# Patient Record
Sex: Male | Born: 1995 | Race: White | Hispanic: No | Marital: Single
Health system: Southern US, Community
[De-identification: ages and names within clinical notes are randomized; demographics above are authoritative.]

## PROBLEM LIST (undated history)

## (undated) HISTORY — PX: INGUINAL HERNIA REPAIR: SHX194

## (undated) HISTORY — PX: HYDROCELE EXCISION / REPAIR: SUR1145

---
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
## 2014-07-13 IMAGING — CR PELVIS - 1-2 VIEW
1 series · 1 of 1 positions shown · non-contrast
Comparison: none

REASON FOR EXAM: twisted playing soccer last night
COMMENTS:   LMP: (Male)

[ap]
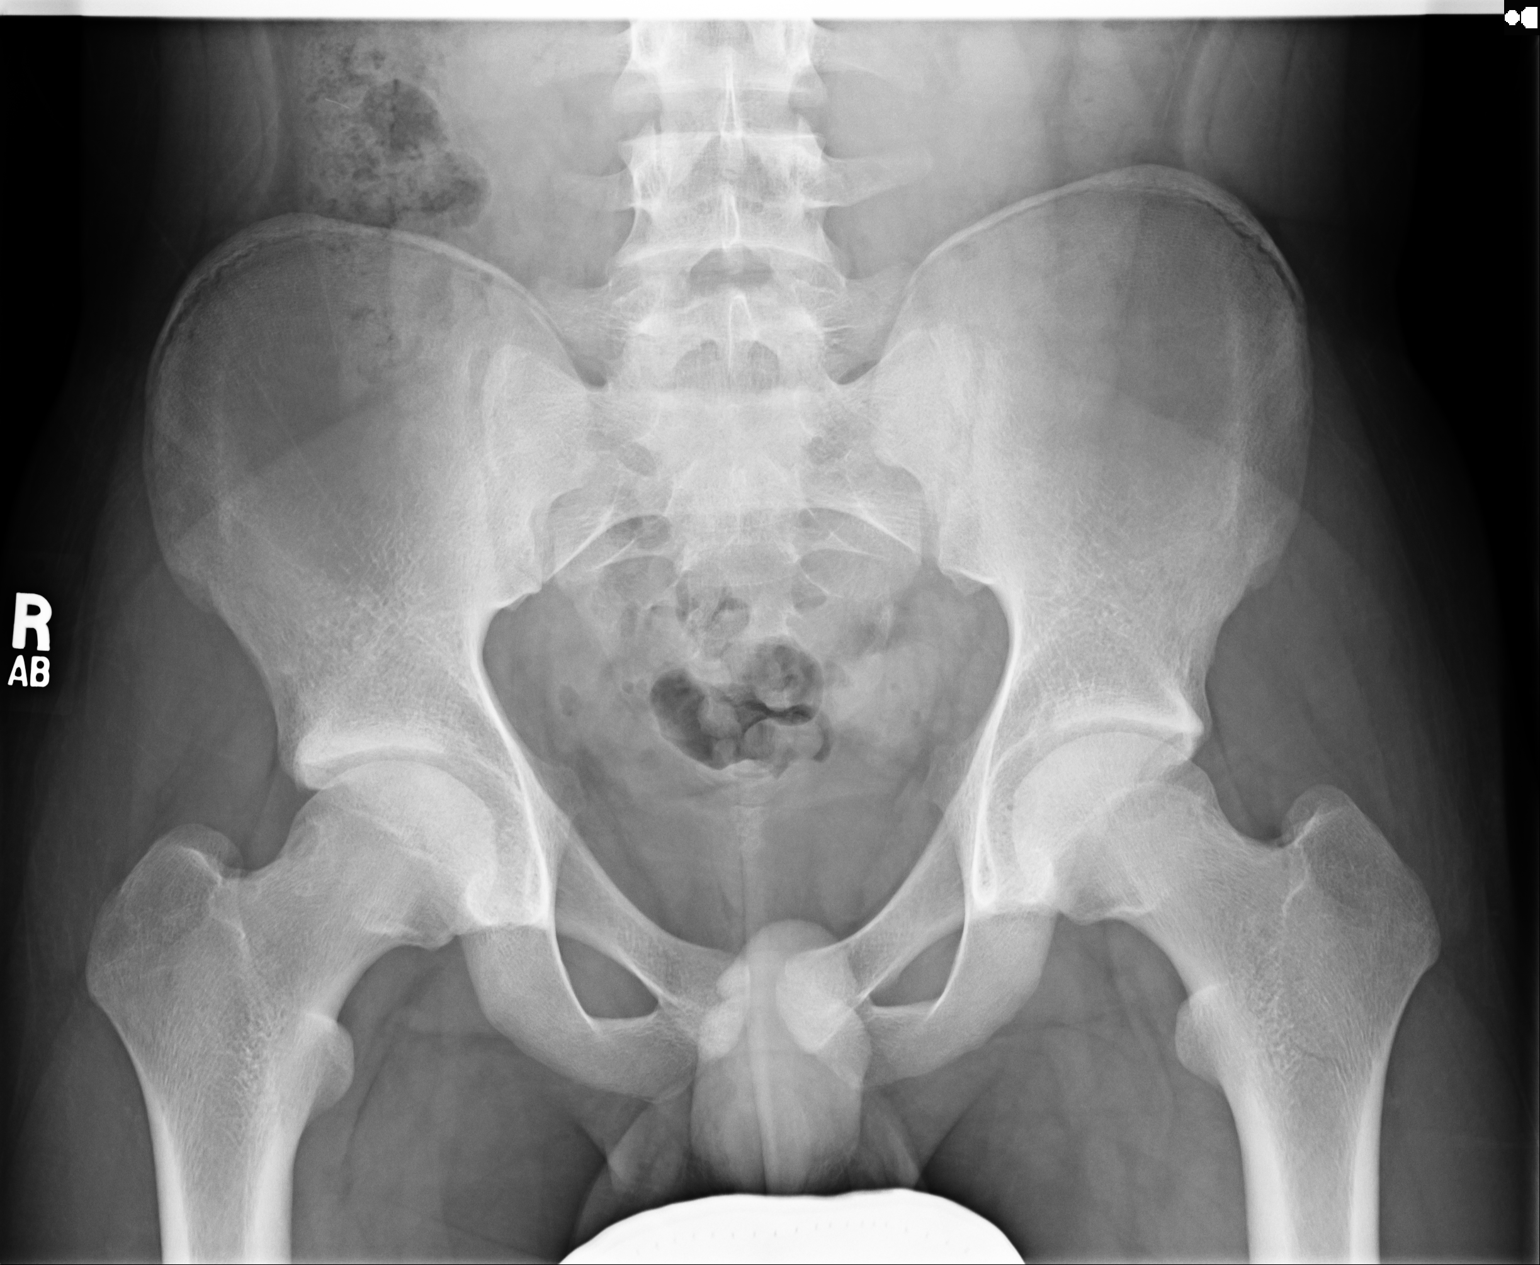

[1 of 1 positions shown; findings below may reference images not displayed]

PROCEDURE:     MDR - MDR PELVIS AP ONLY  - April 24, 2012  [DATE]

RESULT:     The bony pelvis appears adequately mineralized. I do not see an
acute pelvic fracture. There is a linear lucency that projects in the
intertrochanteric and subtrochanteric region of the left hip. This could
reflect a nondisplaced fracture but a left hip series will be needed for
further evaluation. The soft tissues of the pelvis appear normal.
IMPRESSION: I do not see definite evidence of an acute pelvic fracture.
I cannot exclude a nondisplaced fracture involving the
intertrochanteric-subtrochanteric region of the left hip. A left hip series
is recommended.

[REDACTED]

## 2014-07-13 IMAGING — CR DG HIP COMPLETE 2+V*L*
1 series · 2 of 2 positions shown · non-contrast
Comparison: none

REASON FOR EXAM: Twisted playing soccer last night
COMMENTS:   LMP: (Male)

[Series 1: ap · 0.17mm/px · 2 of 2 slices shown]
[im 1/2]
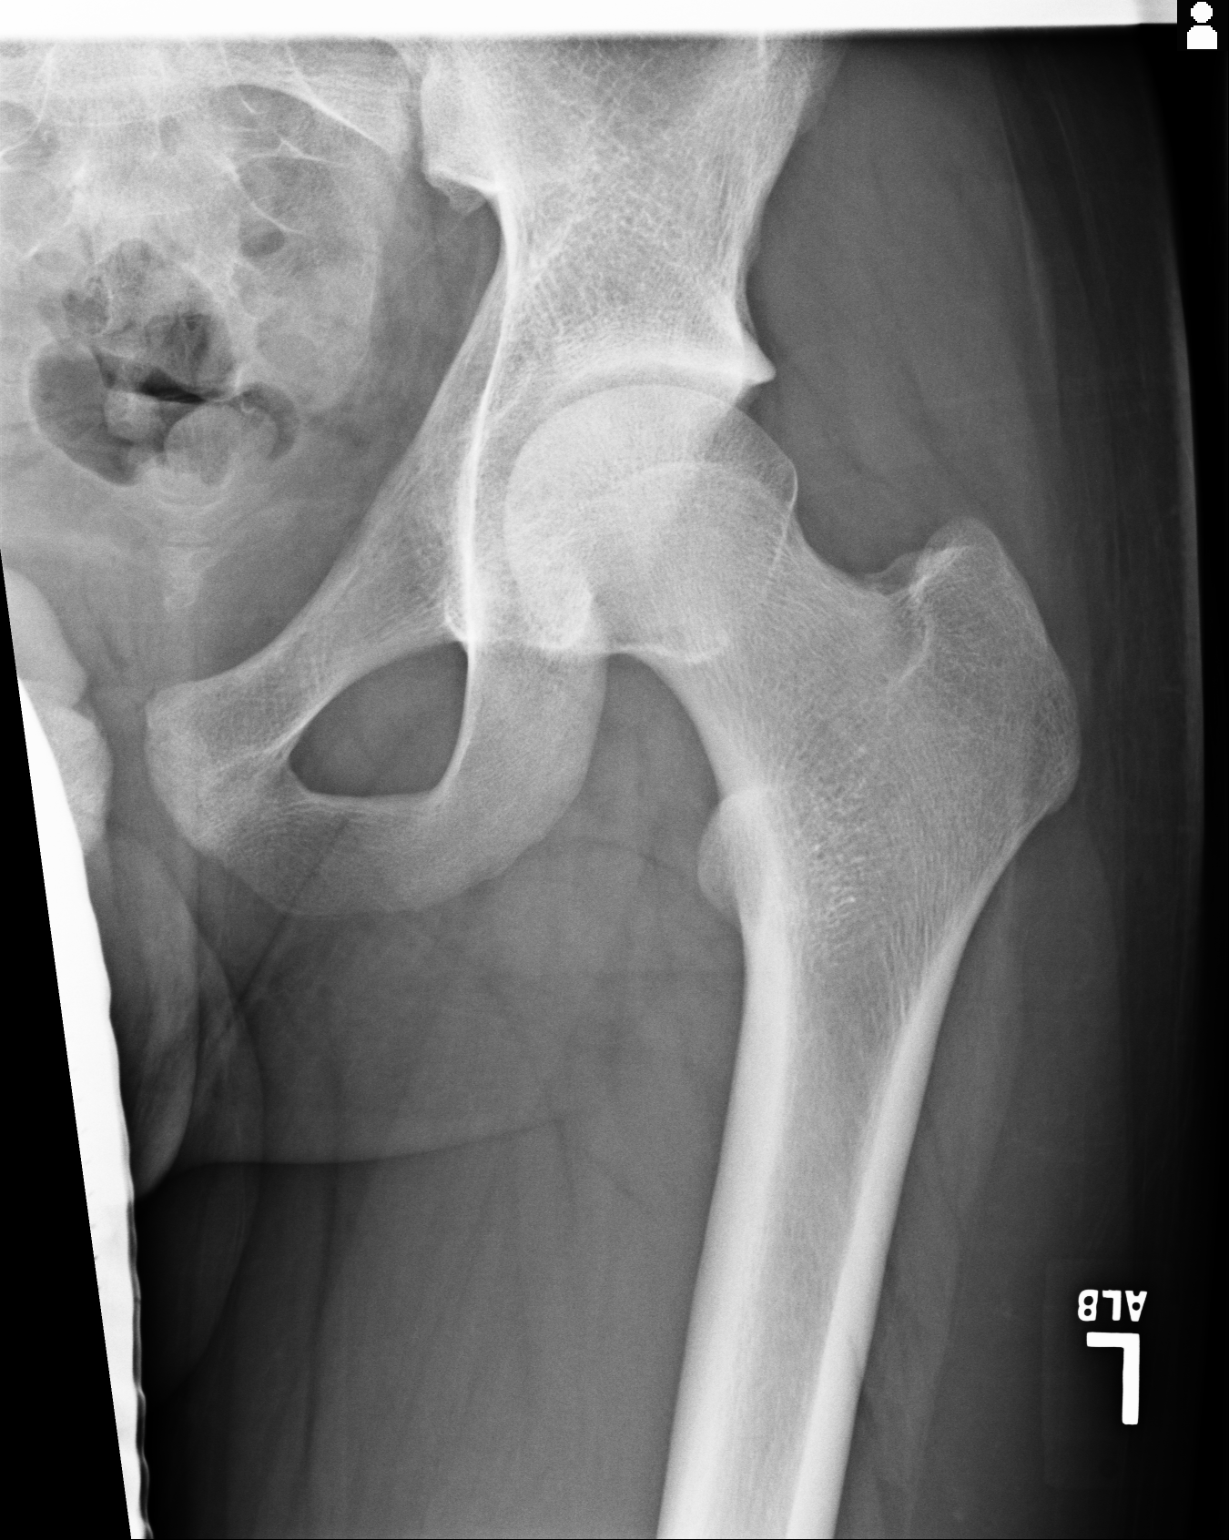
[im 2/2]
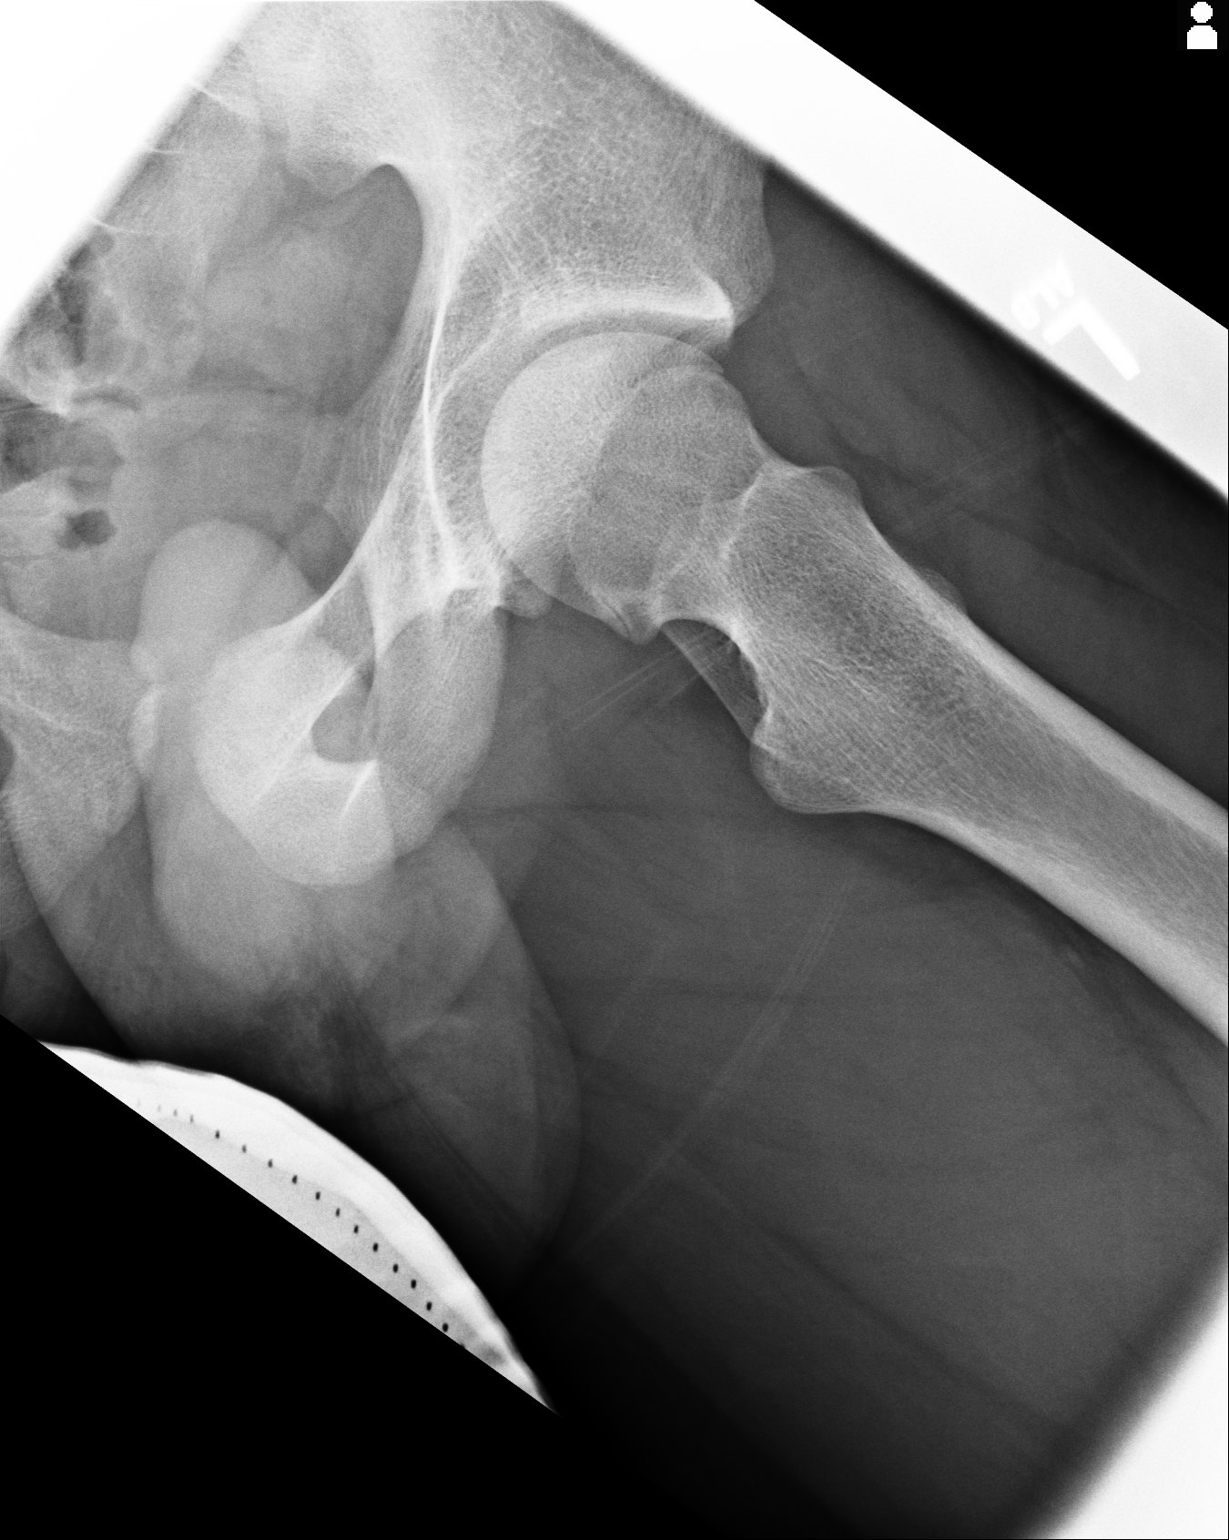

[2 of 2 positions shown; findings below may reference images not displayed]

PROCEDURE:     MDR - MDR HIP LEFT COMPLETE  - April 24, 2012  [DATE]

RESULT:     AP and lateral views of the left hip reveal no evidence of an
acute fracture. The questionable lucency demonstrated on the earlier pelvis
film is not seen on the current exam. The left hemipelvis appears normal.
The hip joint spaces preserved.
IMPRESSION: There is no evidence of an acute fracture of the left hip.
If the patient's symptoms persist and remain unexplained, followup MRI may
be most useful next imaging modality.

[REDACTED]

## 2014-10-21 NOTE — Op Note (Signed)
PATIENT NAMTanja Richards:  Abalos, Tracey MR#:  [id number] DATE OF BIRTH:  1995/12/30  DATE OF PROCEDURE:  05/29/2012  PREOPERATIVE DIAGNOSIS: Left inguinal hernia/hydrocele.   POSTOPERATIVE DIAGNOSIS: Left inguinal hernia/hydrocele.   PROCEDURE: Left inguinal hydrocelectomy.   SURGEON: Assunta GamblesBrian Josclyn Rosales, M.D.   ANESTHESIA: Laryngeal mask airway anesthesia.   INDICATIONS: The patient is a 19 year old white gentleman who has been noted to have a progressively enlarging right hydrocele. 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 He presents for inguinal hydrocelectomy.   DESCRIPTION OF PROCEDURE: After informed consent was obtained, the patient was taken to the Operating Room and placed in the supine position on the operating table under laryngeal mask airway anesthesia. The patient was then prepped and draped in the usual standard fashion. An approximate 4 cm inguinal incision was made over the groin crease. The incision was continued down to expose the external fascia. The external inguinal ring was not easily identified. The lower end of the symphysis pubis was identified with no well-defined inguinal ring. The cord contents however could be palpated just below, at the approximate site of the ring or canal. A small incision was made through the external fascia. The cord contents and ilioinguinal nerve were easily identified. The nerve was mobilized medially. The cord contents were freed from the canal. The lower end of the canal still had fibers covering the cord. These were opened to allow freedom of the remaining portion of the cord. The testicle was then delivered from the scrotum into the inguinal incision site. A slight opening of the incision was required for removal through the inguinal incision, due to its excise. The cord contents were then examined. A very small ductus was identified. It was incised at the level of the internal ring. The area was suture ligated utilizing a silk suture. Areas of  the ductus were not able to be identified through the cord contents The decision was made to not further attempt removal of the remaining portion of the ductus. The hydrocele sac was then opened. A moderate amount of fluid was drained. A calcified structure was present in the hydrocele sac. This was removed without difficulty. The hydrocele sac was then evaginated around the posterior aspect of the testicle. No significant trimming was required. Intermittent 3-0 chromic sutures were placed posterior to the testicle to complete the evagination. The testicle was then returned back to the hemiscrotal compartment. The external fascia was closed utilizing a 3-0 Vicryl suture. Care was taken to visualize the ilioinguinal nerve throughout closure. The subcutaneous tissue was then closed utilizing a plain gut suture. The skin was closed utilizing a 4-0 running Vicryl subcuticular stitch. Steri-Strips, Telfa, and Tegaderm dressing was applied. 10 mL of half-strength Marcaine was injected at the incision site. The patient tolerated the procedure well. There were no problems or complications. Estimated blood loss was minimal.  ____________________________ Madolyn FriezeBrian S. Achilles Dunkope, MD bsc:slb D: 05/30/2012 07:51:02 ET T: 05/30/2012 09:48:24 ET JOB#: [id number]  cc: Madolyn FriezeBrian S. Achilles Dunkope, MD, <Dictator> Madolyn FriezeBRIAN S Akeila Lana MD ELECTRONICALLY SIGNED 05/30/2012 20:48

## 2014-10-25 NOTE — Op Note (Signed)
PATIENT NAMTanja Richards:  [name], [name] DATE OF BIRTH:  [birth year]  DATE OF PROCEDURE:  07/09/2013  PREOPERATIVE DIAGNOSIS: Right recurrent hydrocele.   POSTOPERATIVE DIAGNOSIS: Right recurrent hydrocele.   PROCEDURE: Right hydrocelectomy via scrotal approach.   SURGEON: Assunta GamblesBrian Chattie Greeson, M.D.   ANESTHESIA: Laryngeal mask airway anesthesia.  INDICATIONS: The patient is an 19 year old gentleman who presented several years prior with a right hydrocele that appeared to be consistent with a patent ductus and pediatric hernia/hydrocele. He underwent a hydrocele repair via an inguinal approach. A small patent ductus was appreciated at that time. The hydrocele was performed in the standard fashion. He developed subsequent recurrence of the hydrocele. He presents for recurrent hydrocelectomy.   DESCRIPTION OF PROCEDURE: After informed consent was obtained, the patient was taken to the operating room and placed in the supine position on the operating table under laryngeal mask airway anesthesia. The patient was then prepped and draped in the usual standard fashion. A midline scrotal incision was made approximately 3.5 cm in length. Dissection was continued down to the hydrocele sac. Significant scarring was noted of the tissue. A plane was able to be identified on the more inferior posterior portion of the testicle. Once this was developed, the approximate location of the cord contents could be identified. It was subsequently encircled. A Penrose drain was placed around the cord contents. This allowed better visualization of some of the remaining attachments to the hemi-scrotal tissue. These were taken down utilizing electrocautery. The hydrocele sac was then opened on its anterior surface. Approximately 200 mL of clear yellow fluid was drained. The tissue was noted to be very thick and vascular. A small extension was noted extending along the cord consistent with a portion of patent ductus. This went  approximately 1.5 cm up the cord with apparent blind ending status. Forceps were placed inside of the ductus extension allowing visualization and subsequent dissection from the surrounding cord tissue. Care was taken to maintain adequate distance from the cord contents. This was able to be performed. The bulk of the hydrocele sac was then dissected free. The bulk edges were cut free and trimmed as close as possible to allow some eversion of the tissue with the bulk of the sac excised. The remaining portion was evaginated posterior to the testicle. It was secured utilizing interrupted 3-0 chromic sutures. The cord component was stripped of the mucosa as best possible. The remaining tissue was evaginated and secured to the surrounding tissue to prevent reclosure. With the extent of the scrotal dissection some slight venous ooze was present. There was concern over the possibility of a hematoma. The decision was made to place a 10 mm Blake drain. This was passed through the most inferior aspect of the scrotum. It was secured to the skin utilizing a nylon suture with the drain in place and the testicle was returned back to the hemiscrotum. The scrotum was then closed in 2 layers. First with a running 3-0 chromic suture and the skin was closed utilizing a 3-0 Monocryl suture in a mattress fashion. Collodion was then applied to the incision. The drain was then placed to bulb suction and charged. A scrotal support and fluffs were then applied. The patient was then awakened from laryngeal mask airway anesthesia and was taken to the recovery room in stable condition. There were no problems or complications. The patient tolerated the procedure well. The pathology specimen was hydrocele sac.   ____________________________ Madolyn FriezeBrian S. Achilles Dunkope, MD bsc:sb D: 07/09/2013 10:50:11 ET T: 07/09/2013  11:17:55 ET JOB#: [id number]  cc: Madolyn Frieze. Achilles Dunk, MD, <Dictator> Madolyn Frieze Tayla Panozzo MD ELECTRONICALLY SIGNED 07/17/2013 22:39

## 2024-07-10 ENCOUNTER — Ambulatory Visit
Admission: EM | Admit: 2024-07-10 | Discharge: 2024-07-10 | Disposition: A | Discharge status: Home / Self Care | Attending: Family Medicine | Admitting: Family Medicine

## 2024-07-10 DIAGNOSIS — K529 Noninfective gastroenteritis and colitis, unspecified: Secondary | ICD-10-CM | POA: Diagnosis not present

## 2024-07-10 DIAGNOSIS — J111 Influenza due to unidentified influenza virus with other respiratory manifestations: Secondary | ICD-10-CM | POA: Diagnosis not present

## 2024-07-10 LAB — POC COVID19/FLU A&B COMBO
Covid Antigen, POC: NEGATIVE
Influenza A Antigen, POC: NEGATIVE
Influenza B Antigen, POC: NEGATIVE

## 2024-07-10 MED ORDER — ONDANSETRON 4 MG PO TBDP: 4.0000 mg | ORAL_TABLET | Freq: Three times a day (TID) | ORAL | 0 refills | Status: AC | PRN

## 2024-07-10 NOTE — ED Triage Notes (Signed)
 Pt c/o fever,emesis,bodyaches & diarrhea that started around 0300 today. Had 4 episodes of emesis PTA. No OTC meds.

## 2024-07-10 NOTE — Discharge Instructions (Signed)
 Your COVID and influenza tests are negative. I suspect a viral illness but this may be food related.  It is important that your stay hydrated. Take the Zofran  to prevent nausea and vomiting.

## 2024-07-10 NOTE — ED Provider Notes (Signed)
 " [facility]    CSN: [id number] Arrival date & time: 07/10/24  9062      History   Chief Complaint Chief Complaint  Patient presents with   Generalized Body Aches   Fever   Fatigue   Diarrhea    HPI [name] is a 29 y.o. male.   HPI  History obtained from the patient. [name] presents for diarrhea, vomiting, nausea, chills, slight cough,  fever, fatigue and bodyaches that started this morning.  He works with public [employer]. He took some Zofran  this morning around 9 AM.  His mom gave it to hm.    No history of asthma.  Denies smoking and vaping.   History reviewed. No pertinent past medical history.  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 Authorizing Provider  cetirizine (ZYRTEC) 10 MG tablet Take 10 mg by mouth. Frequency:QD   Dosage:10   MG  Instructions:  Note:Dose: 10MG  06/08/12  Yes [provider]  fluticasone (FLONASE) 50 MCG/ACT nasal spray Frequency:PRN   Dosage:50   MCG  Instructions:  Note:Dose: 06/08/12  Yes [provider]  ondansetron  (ZOFRAN -ODT) 4 MG disintegrating tablet Take 1 tablet (4 mg total) by mouth every 8 (eight) hours as needed. 07/10/24  Yes Kanyla Omeara, DO    Family History History reviewed. No pertinent family history.  Social History Social History[1]   Allergies   Patient has no known allergies.   Review of Systems Review of Systems: negative unless otherwise stated in HPI.      Physical Exam Triage Vital Signs ED Triage Vitals  Encounter Vitals Group     BP --      Girls Systolic BP Percentile --      Girls Diastolic BP  Percentile --      Boys Systolic BP Percentile --      Boys Diastolic BP Percentile --      Pulse --      Resp 07/10/24 1100 16     Temp --      Temp Source 07/10/24 1100 Oral     SpO2 --      Weight 07/10/24 1057 199 lb 6.4 oz (90.4 kg)     Height --      Head Circumference --      Peak Flow --      Pain Score 07/10/24 1058 4     Pain Loc --      Pain Education --      Exclude from Growth Chart --    No data found.  Updated Vital Signs BP 120/79 (BP Location: Left Arm)   Pulse 98   Temp 98.7 F (37.1 C) (Oral)   Resp 16   Wt 90.4 kg   SpO2 95%   Visual Acuity Right Eye Distance:   Left Eye Distance:   Bilateral Distance:    Right Eye Near:   Left Eye Near:    Bilateral Near:     Physical Exam GEN:     alert, non-toxic appearing male in no distress    HENT:  mucus membranes moist,  oropharyngeal without lesions or erythema, no tonsillar hypertrophy or exudates, no  nasal discharge EYES:   no scleral injection or discharge NECK:  normal ROM, no meningismus   RESP:  no increased work of breathing, clear to auscultation bilaterally CVS:   regular rate and rhythm ABD:   Soft, nontender, nondistended, no guarding, no rebound, active bowel sounds throughout, negative McBurney's, negative Murphy's Skin:   warm and dry, no jaundice    UC Treatments / Results  Labs (all labs ordered are listed, but only abnormal results are displayed) Labs Reviewed  POC COVID19/FLU A&B COMBO - Normal    EKG   Radiology No results found.   Procedures Procedures (including critical care time)  Medications Ordered in UC Medications - No data to display  Initial Impression / Assessment and Plan / UC Course  I have reviewed the triage vital signs and the nursing notes.  Pertinent labs & imaging results that were available during my care of the patient were reviewed by me and considered in my medical decision making (see chart for details).       Pt is a 29 y.o. male who  presents for 1 day of respiratory and GI symptoms. Dameir is afebrile here without recent antipyretics. Satting well on room air. Overall pt is non-toxic appearing, well hydrated, without respiratory distress.  Cardiopulmonary and abdominal exams are unremarkable.  POC COVID and influenza panel obtained and was negative.  Offered Zofran  but he declined.  Suspect viral respiratory illness versus foodborne gastroenteritis. Discussed symptomatic treatment.  Zofran  prescribed at patient's request.  Explained lack of efficacy of antibiotics in viral disease.  Typical duration of symptoms discussed.   Return and ED precautions given and voiced understanding. Discussed MDM, treatment plan and plan for follow-up with patient who agrees with plan.     Final Clinical Impressions(s) / UC Diagnoses   Final diagnoses:  Gastroenteritis  Influenza-like illness     Discharge Instructions      Your COVID and influenza tests are negative. I suspect a viral illness but this may be food related.  It is important that your stay hydrated. Take the Zofran  to prevent nausea and vomiting.       ED Prescriptions     Medication Sig Dispense Auth. Provider   ondansetron  (ZOFRAN -ODT) 4 MG disintegrating tablet Take 1 tablet (4 mg total) by mouth every 8 (eight) hours as needed. 20 tablet Maevyn Riordan, DO      PDMP not reviewed this encounter.     [1]  Social History Tobacco Use   Smoking status: Never   Smokeless tobacco: Never  Vaping Use   Vaping status: Never Used  Substance Use Topics   Alcohol use: Yes   Drug use: Yes    Types: Methamphetamines, Marijuana     Nyima Vanacker, DO 07/10/24 1200  "
